# Patient Record
Sex: Male | Born: 1975 | Race: White | Hispanic: No | Marital: Single
Health system: Midwestern US, Academic
[De-identification: ages and names within clinical notes are randomized; demographics above are authoritative.]

---

## 2016-10-15 ENCOUNTER — Encounter: Admit: 2016-10-15 | Discharge: 2016-10-15 | Payer: MEDICAID

## 2016-10-15 NOTE — Telephone Encounter
Receive alternate request from [facility].   Insurance doesn't cover Contour next.   Called patient to ok sending script for one touch supplies/meter.   Patient agreed that was ok.   Sending script for One Touch Verio test strips, lancets and meter.

## 2016-12-31 ENCOUNTER — Encounter: Admit: 2016-12-31 | Discharge: 2016-12-31 | Payer: MEDICAID

## 2016-12-31 NOTE — Telephone Encounter
pt confirmed appt.

## 2017-01-07 ENCOUNTER — Encounter: Admit: 2017-01-07 | Discharge: 2017-01-07 | Payer: MEDICAID

## 2017-01-07 ENCOUNTER — Ambulatory Visit: Admit: 2017-01-07 | Discharge: 2017-01-07 | Payer: MEDICAID

## 2017-01-07 DIAGNOSIS — R748 Abnormal levels of other serum enzymes: ICD-10-CM

## 2017-01-07 DIAGNOSIS — E119 Type 2 diabetes mellitus without complications: Principal | ICD-10-CM

## 2017-01-07 DIAGNOSIS — I10 Essential (primary) hypertension: ICD-10-CM

## 2017-01-07 DIAGNOSIS — K709 Alcoholic liver disease, unspecified: Secondary | ICD-10-CM

## 2017-01-07 DIAGNOSIS — E785 Hyperlipidemia, unspecified: ICD-10-CM

## 2017-01-07 DIAGNOSIS — F1020 Alcohol dependence, uncomplicated: ICD-10-CM

## 2017-01-07 DIAGNOSIS — Z0000 Encounter for general adult medical examination without abnormal findings: ICD-10-CM

## 2017-01-07 DIAGNOSIS — Z23 Encounter for immunization: ICD-10-CM

## 2017-01-07 DIAGNOSIS — IMO0002 Alcohol use disorder: ICD-10-CM

## 2017-01-07 LAB — COMPREHENSIVE METABOLIC PANEL
Lab: 0.6 mg/dL (ref 0.3–1.2)
Lab: 0.9 mg/dL (ref 0.4–1.24)
Lab: 129 MMOL/L — ABNORMAL LOW (ref 137–147)
Lab: 4.8 MMOL/L (ref 3.5–5.1)
Lab: 408 mg/dL — ABNORMAL HIGH (ref 70–100)
Lab: 9.2 mg/dL (ref 8.5–10.6)
Lab: >60 mL/min (ref >60)

## 2017-01-07 LAB — LIPID PROFILE
Lab: 156 mg/dL — ABNORMAL HIGH (ref <150)
Lab: 24 mg/dL — ABNORMAL LOW (ref >40)
Lab: 312 mg/dL (ref 3–12)
Lab: 336 mg/dL — ABNORMAL HIGH (ref <200)
Lab: 58 mg/dL (ref <100)

## 2017-01-07 LAB — CBC AND DIFF
Lab: 5.1 M/UL (ref 4.4–5.5)
Lab: 5.9 10*3/uL (ref 4.5–11.0)

## 2017-01-07 LAB — BILIRUBIN, DIRECT: Lab: 0.1 mg/dL (ref <0.4)

## 2017-01-07 LAB — PROTIME INR (PT): Lab: 0.9 MMOL/L — ABNORMAL LOW (ref 0.8–1.2)

## 2017-01-07 NOTE — Progress Notes
Date of Service: 01/07/2017    Roy Madden is a 41 y.o. male.  DOB: 03-29-1976  MRN: [id number]     Subjective:             History of Present Illness    Mr. 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 Since last visit he has been very successful at reducing his drinking. He is now drinking 2 beers per week instead of >300 g alcohol per day. He has not lost any weight and is not exercising regularly. He notes that his diabetes has been poorly controlled with HbA1c last measured at >13. Currently he states that he feels well. He has some numbness in his legs but denies vision changes. He denies edema, abdominal swelling, hematemesis, melena, jaundice or episodes of confusion.       Past Medical History:   Diagnosis Date   ??? Alcohol use disorder    ??? Hyperlipidemia    ??? Hypertension    ??? Obesity, Class III, BMI 40-49.9 (morbid obesity) (HCC)    ??? Type 2 diabetes mellitus (HCC)           Review of Systems   Constitutional: Positive for diaphoresis and fatigue.   HENT: Positive for hearing loss.    Respiratory: Positive for chest tightness.    Cardiovascular: Positive for chest pain.   Musculoskeletal: Positive for arthralgias and back pain.   Psychiatric/Behavioral: Positive for confusion, decreased concentration, dysphoric mood and sleep disturbance. The patient is nervous/anxious.          Objective:         ??? albuterol (VENTOLIN HFA, PROAIR HFA) 90 mcg/actuation inhaler Inhale 2 Puffs by mouth every 6 hours as needed for Wheezing.   ??? ASCENSIA CONTOUR test strip Use 1 strip as directed before meals and at bedtime.   ??? canagliflozin (INVOKANA) 300 mg tablet Take 1 tablet by mouth daily with breakfast.   ??? fenofibrate nanocrystallized (TRICOR) 145 mg tablet Take 145 mg by mouth daily.   ??? ibuprofen (MOTRIN) 800 mg tablet Take 800 mg by mouth every 6 hours as needed for Pain. ??? insulin glargine (BASAGLAR KWIKPEN) 100 unit/mL (3 mL) injection PEN Inject 30 Units under the skin at bedtime daily.   ??? insulin lispro(+) (HUMALOG KWIKPEN) 100 unit/mL injection PEN Inject 10 Units into area(s) as directed three times daily with meals.   ??? losartan (COZAAR) 50 mg tablet Take 50 mg by mouth daily.   ??? metoclopramide (REGLAN) 10 mg tablet TAKE 1 TABLET BY MOUTH 3 TIMES A DAY   ??? mirtazapine (REMERON) 30 mg tablet Take 30 mg by mouth at bedtime daily.   ??? omeprazole DR(+) (PRILOSEC) 40 mg capsule Take 40 mg by mouth daily.   ??? oxyCODONE (OXY-IR) 15 mg tablet Take 15 mg by mouth every 4 hours as needed   ??? Sitagliptin-Metformin (JANUMET) 50-1,000 mg tab Take 1 Tab by mouth twice daily with meals.   ??? TRESIBA FLEXTOUCH U-100 100 unit/mL (3 mL) inpn      Vitals:    01/07/17 1418   BP: 139/81   Pulse: 102   Resp: 18   Temp: 36.8 ???C (98.2 ???F)   TempSrc: Oral   SpO2: 98%   Weight: 135.2 kg (298 lb)   Height: 182.9 cm (72)     Body mass index is 40.42 kg/m???.     Physical Exam  Constitutional: He is oriented to person, place, and time. He appears well-developed and well-nourished. No distress.   Obese   HENT:   Head: Normocephalic and atraumatic.   Eyes: EOM are normal. Pupils are equal, round, and reactive to light. No scleral icterus.   Neck: Normal range of motion. Neck supple. No thyromegaly present.   Cardiovascular: Normal rate, regular rhythm and normal heart sounds.    Pulmonary/Chest: Effort normal and breath sounds normal.   Abdominal: Soft. Normal appearance and bowel sounds are normal. He exhibits distension (obesity). He exhibits no shifting dullness. There is no hepatosplenomegaly (Could not palpate liver or spleen). There is no tenderness.   Musculoskeletal: He exhibits no edema.   Neurological: He is alert and oriented to person, place, and time.   Skin: Skin is warm and dry. No erythema.   Psychiatric: He has a normal mood and affect. His behavior is normal. Comprehensive Metabolic Profile    Lab Results   Component Value Date/Time    NA 129 (L) 01/07/2017 03:13 PM    K 4.8 01/07/2017 03:13 PM    CL 96 (L) 01/07/2017 03:13 PM    CO2 26 01/07/2017 03:13 PM    GAP 7 01/07/2017 03:13 PM    BUN 10 01/07/2017 03:13 PM    CR 0.96 01/07/2017 03:13 PM    GLU 408 (H) 01/07/2017 03:13 PM    Lab Results   Component Value Date/Time    CA 9.2 01/07/2017 03:13 PM    ALBUMIN 4.2 01/07/2017 03:13 PM    TOTPROT 6.6 01/07/2017 03:13 PM    ALKPHOS 113 (H) 01/07/2017 03:13 PM    AST 67 (H) 01/07/2017 03:13 PM    ALT 143 (H) 01/07/2017 03:13 PM    TOTBILI 0.6 01/07/2017 03:13 PM    GFR >60 01/07/2017 03:13 PM    GFRAA >60 01/07/2017 03:13 PM        Lab Results   Component Value Date    CHOL 336 (H) 01/07/2017    TRIG 1,561 (H) 01/07/2017    HDL 24 (L) 01/07/2017    LDL 58 01/07/2017    VLDL 161 01/07/2017    NONHDLCHOL 312 01/07/2017               Assessment and Plan:    1. 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 In spite of his cutting back dramatically on his drinking, his ALT remains quite high with AST<ALT. 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    2. Diabetes mellitus. His diabetes is very poorly controled with glucose >400 and HbA1c 13.0 today. He admitted to me that he just stopped taking his insulin because he dose not like to give himself injections. He is supposed to be taking 60 U per day. He needs to follow up with his PCP as soon as possible for diabetes control. I urged him to go back on his medications as prescribed. He is also on invokana and Janumet.    3. Hyperlipidemia. He has very elevated total cholesterol, LDL and triglycerides and low HDL. He is on fenofibrate but not on a statin. I think he would likely benefit from statin therapy and recommend he discuss this with his PCP. There is no liver contraindication to taking statins in this case. 4. Obesity. Weight loss is critical to his health for multiple reasons including diabetes, hyperlipidemia and NASH. I have  suggested that he get a consultation for bariatric surgery to discuss the pro and cons with the surgery team.    5. Hypertension. Reasonable control today      Plan:  CBC, CMP, INR today --DONE  HbA1c, lipid panel --DONE  Resume all his diabetes meds including insulin  Follow up with his PCP for Diabetes and lipid managment  Referral for consultation re: bariatric surgery  Continue to avoid heavy drinking  F/U with me in 6 months

## 2017-01-08 LAB — HEMOGLOBIN A1C: Lab: 13 % — ABNORMAL HIGH (ref 4.0–6.0)

## 2017-01-26 ENCOUNTER — Encounter: Admit: 2017-01-26 | Discharge: 2017-01-26 | Payer: MEDICAID

## 2017-01-26 NOTE — Telephone Encounter
[facility] via [facility]mymeds.   Invokana 300 mg tablets has been rejected.   Routing to [employer], APRN for alternative med.

## 2017-01-26 NOTE — Telephone Encounter
Are any meds in that class covered, farxiga or jardiance?

## 2017-01-27 MED ORDER — EMPAGLIFLOZIN 25 MG PO TAB
25 mg | ORAL_TABLET | Freq: Every day | ORAL | 3 refills | Status: AC
Start: 2017-01-27 — End: ?

## 2017-01-27 NOTE — Telephone Encounter
Both Jardiance and Marcelline Deist are covered

## 2017-01-27 NOTE — Telephone Encounter
I have changed to Jardiance.    Pt will need an appt for future care here, has cancelled the last 2.Marland Kitchen

## 2017-03-24 ENCOUNTER — Encounter: Admit: 2017-03-24 | Discharge: 2017-03-24 | Payer: MEDICAID

## 2017-03-24 NOTE — Telephone Encounter
[facility]  States Jardiance requires PA  Can use Covermymeds for PA  KEY: [id number]    Called pharmacy to clarify, as phone note from October states that [place]Jardiance and Farxiga are preferred level 1 medications  They state they are still getting a PA required notification    Called insurance at [phone] to clarify what medications are covered/not covered, as we have conflicting information  This number states that [employer]'s number for PA's is [phone], called this number to clarify what medications are preferred  Waited on hold for 30+ minutes, representative I spoke with was unable to tell me which medications are preferred    Completed PA through covermymeds: "This request has received a Favorable outcome.  Please note any additional information provided by [employer] at the bottom of this request.Questionnaire submitted. PA Case [id number] Status: Approved. Authorization and Notifications Completed."  Covermymeds notification of Jardiance approval  Will await fax confirmation of approval

## 2017-03-25 NOTE — Telephone Encounter
Received fax from [employer]  States Roy Madden has been approved through 03/24/18  Copy of letter to [facility] to scan

## 2017-05-13 ENCOUNTER — Encounter: Admit: 2017-05-13 | Discharge: 2017-05-13 | Payer: MEDICAID

## 2017-05-20 ENCOUNTER — Ambulatory Visit: Admit: 2017-05-20 | Discharge: 2017-05-20 | Payer: MEDICAID

## 2017-05-20 ENCOUNTER — Encounter: Admit: 2017-05-20 | Discharge: 2017-05-20 | Payer: MEDICAID

## 2017-05-20 DIAGNOSIS — E119 Type 2 diabetes mellitus without complications: ICD-10-CM

## 2017-05-20 DIAGNOSIS — Z0000 Encounter for general adult medical examination without abnormal findings: ICD-10-CM

## 2017-05-20 DIAGNOSIS — IMO0002 Alcohol use disorder: ICD-10-CM

## 2017-05-20 DIAGNOSIS — I10 Essential (primary) hypertension: ICD-10-CM

## 2017-05-20 DIAGNOSIS — E785 Hyperlipidemia, unspecified: ICD-10-CM

## 2017-05-20 DIAGNOSIS — K709 Alcoholic liver disease, unspecified: ICD-10-CM

## 2017-05-20 DIAGNOSIS — F1020 Alcohol dependence, uncomplicated: Principal | ICD-10-CM

## 2017-05-20 DIAGNOSIS — K760 Fatty (change of) liver, not elsewhere classified: ICD-10-CM

## 2017-05-20 DIAGNOSIS — Z23 Encounter for immunization: ICD-10-CM

## 2017-05-20 DIAGNOSIS — R748 Abnormal levels of other serum enzymes: Secondary | ICD-10-CM

## 2017-05-20 LAB — CBC AND DIFF
Lab: 0 % (ref >60)
Lab: 0 10*3/uL (ref 0–0.20)
Lab: 0.1 10*3/uL (ref 0–0.45)
Lab: 0.6 10*3/uL (ref 0–0.80)
Lab: 1 % (ref >60)
Lab: 10 FL (ref 7–11)
Lab: 12 % (ref 11–15)
Lab: 17 g/dL — ABNORMAL HIGH (ref 13.5–16.5)
Lab: 186 K/UL — ABNORMAL HIGH (ref 150–400)
Lab: 2.4 10*3/uL (ref 1.0–4.8)
Lab: 28 % — ABNORMAL HIGH (ref 24–44)
Lab: 29 pg (ref 26–34)
Lab: 33 g/dL (ref 32.0–36.0)
Lab: 5.5 10*3/uL (ref 1.8–7.0)
Lab: 5.7 M/UL — ABNORMAL HIGH (ref 4.4–5.5)
Lab: 51 % — ABNORMAL HIGH (ref 40–50)
Lab: 64 % (ref 41–77)
Lab: 7 % (ref 4–12)
Lab: 8.7 10*3/uL — ABNORMAL LOW (ref 4.5–11.0)
Lab: 89 FL (ref 80–100)

## 2017-05-20 LAB — COMPREHENSIVE METABOLIC PANEL
Lab: 132 MMOL/L — ABNORMAL LOW (ref 137–147)
Lab: 4.6 MMOL/L (ref 3.5–5.1)

## 2017-05-20 LAB — PROTIME INR (PT): Lab: 0.9 (ref 0.8–1.2)

## 2017-05-20 LAB — BILIRUBIN, DIRECT

## 2017-05-21 LAB — HEMOGLOBIN A1C: Lab: 12 % — ABNORMAL HIGH (ref 4.0–6.0)

## 2017-05-24 ENCOUNTER — Encounter: Admit: 2017-05-24 | Discharge: 2017-05-24 | Payer: MEDICAID

## 2017-05-24 DIAGNOSIS — IMO0002 Alcohol use disorder: ICD-10-CM

## 2017-05-24 DIAGNOSIS — I10 Essential (primary) hypertension: ICD-10-CM

## 2017-05-24 DIAGNOSIS — E119 Type 2 diabetes mellitus without complications: Principal | ICD-10-CM

## 2017-05-24 DIAGNOSIS — E785 Hyperlipidemia, unspecified: ICD-10-CM

## 2017-06-23 ENCOUNTER — Encounter: Admit: 2017-06-23 | Discharge: 2017-06-23 | Payer: MEDICAID

## 2017-06-23 DIAGNOSIS — E119 Type 2 diabetes mellitus without complications: Principal | ICD-10-CM

## 2017-06-23 MED ORDER — CONTOUR TEST STRIPS MISC STRP
1 | ORAL_STRIP | Freq: Before meals | 3 refills | Status: AC
Start: 2017-06-23 — End: ?

## 2017-11-05 ENCOUNTER — Encounter: Admit: 2017-11-05 | Discharge: 2017-11-05 | Payer: MEDICAID

## 2017-11-09 ENCOUNTER — Ambulatory Visit: Admit: 2017-11-09 | Discharge: 2017-11-09 | Payer: MEDICAID

## 2017-11-09 ENCOUNTER — Encounter: Admit: 2017-11-09 | Discharge: 2017-11-09 | Payer: MEDICAID

## 2017-11-09 DIAGNOSIS — M5116 Intervertebral disc disorders with radiculopathy, lumbar region: Principal | ICD-10-CM

## 2017-11-09 DIAGNOSIS — I10 Essential (primary) hypertension: ICD-10-CM

## 2017-11-09 DIAGNOSIS — E785 Hyperlipidemia, unspecified: ICD-10-CM

## 2017-11-09 DIAGNOSIS — M5416 Radiculopathy, lumbar region: Secondary | ICD-10-CM

## 2017-11-09 DIAGNOSIS — E119 Type 2 diabetes mellitus without complications: Principal | ICD-10-CM

## 2017-11-09 DIAGNOSIS — IMO0002 Alcohol use disorder: ICD-10-CM

## 2017-11-09 MED ORDER — TIZANIDINE 4 MG PO TAB
4 mg | ORAL_TABLET | Freq: Two times a day (BID) | ORAL | 3 refills | Status: AC
Start: 2017-11-09 — End: 2017-11-09

## 2017-11-09 MED ORDER — TIZANIDINE 4 MG PO TAB
4 mg | ORAL_TABLET | Freq: Two times a day (BID) | ORAL | 3 refills | Status: AC
Start: 2017-11-09 — End: ?

## 2017-11-19 ENCOUNTER — Encounter: Admit: 2017-11-19 | Discharge: 2017-11-19 | Payer: MEDICAID

## 2017-11-23 ENCOUNTER — Encounter: Admit: 2017-11-23 | Discharge: 2017-11-23 | Payer: MEDICAID

## 2017-11-27 ENCOUNTER — Encounter: Admit: 2017-11-27 | Discharge: 2017-11-27 | Payer: MEDICAID

## 2017-12-01 ENCOUNTER — Encounter: Admit: 2017-12-01 | Discharge: 2017-12-01 | Payer: MEDICAID

## 2017-12-03 ENCOUNTER — Encounter: Admit: 2017-12-03 | Discharge: 2017-12-03 | Payer: MEDICAID

## 2017-12-03 ENCOUNTER — Ambulatory Visit: Admit: 2017-12-03 | Discharge: 2017-12-04 | Payer: MEDICAID

## 2017-12-03 DIAGNOSIS — I10 Essential (primary) hypertension: ICD-10-CM

## 2017-12-03 DIAGNOSIS — E785 Hyperlipidemia, unspecified: ICD-10-CM

## 2017-12-03 DIAGNOSIS — IMO0002 Alcohol use disorder: ICD-10-CM

## 2017-12-03 DIAGNOSIS — E119 Type 2 diabetes mellitus without complications: Principal | ICD-10-CM

## 2017-12-03 DIAGNOSIS — M47817 Spondylosis without myelopathy or radiculopathy, lumbosacral region: Principal | ICD-10-CM

## 2017-12-21 ENCOUNTER — Ambulatory Visit: Admit: 2017-12-21 | Discharge: 2017-12-21 | Payer: MEDICAID

## 2017-12-21 DIAGNOSIS — M47817 Spondylosis without myelopathy or radiculopathy, lumbosacral region: Principal | ICD-10-CM

## 2018-01-04 ENCOUNTER — Encounter: Admit: 2018-01-04 | Discharge: 2018-01-04 | Payer: MEDICAID

## 2018-01-04 DIAGNOSIS — M47817 Spondylosis without myelopathy or radiculopathy, lumbosacral region: Principal | ICD-10-CM

## 2018-01-04 DIAGNOSIS — M5416 Radiculopathy, lumbar region: Principal | ICD-10-CM

## 2018-01-04 DIAGNOSIS — E119 Type 2 diabetes mellitus without complications: Principal | ICD-10-CM

## 2018-01-04 DIAGNOSIS — IMO0002 Alcohol use disorder: ICD-10-CM

## 2018-01-04 DIAGNOSIS — I10 Essential (primary) hypertension: ICD-10-CM

## 2018-01-04 DIAGNOSIS — E785 Hyperlipidemia, unspecified: ICD-10-CM

## 2018-01-04 MED ORDER — BUPIVACAINE (PF) 0.25 % (2.5 MG/ML) IJ SOLN
6 mL | Freq: Once | INTRAMUSCULAR | 0 refills | Status: CP
Start: 2018-01-04 — End: ?
  Administered 2018-01-04: 15:00:00 6 mL via INTRAMUSCULAR

## 2018-01-05 ENCOUNTER — Ambulatory Visit: Admit: 2018-01-04 | Discharge: 2018-01-05 | Payer: MEDICAID

## 2018-01-05 DIAGNOSIS — E119 Type 2 diabetes mellitus without complications: ICD-10-CM

## 2018-01-05 DIAGNOSIS — E785 Hyperlipidemia, unspecified: ICD-10-CM

## 2018-01-05 DIAGNOSIS — M5416 Radiculopathy, lumbar region: ICD-10-CM

## 2018-01-05 DIAGNOSIS — M47817 Spondylosis without myelopathy or radiculopathy, lumbosacral region: Principal | ICD-10-CM

## 2018-01-05 DIAGNOSIS — I10 Essential (primary) hypertension: ICD-10-CM

## 2018-01-11 ENCOUNTER — Encounter: Admit: 2018-01-11 | Discharge: 2018-01-11 | Payer: MEDICAID

## 2018-01-11 DIAGNOSIS — I10 Essential (primary) hypertension: ICD-10-CM

## 2018-01-11 DIAGNOSIS — E785 Hyperlipidemia, unspecified: ICD-10-CM

## 2018-01-11 DIAGNOSIS — IMO0002 Alcohol use disorder: ICD-10-CM

## 2018-01-11 DIAGNOSIS — E119 Type 2 diabetes mellitus without complications: Principal | ICD-10-CM

## 2018-01-11 DIAGNOSIS — M5416 Radiculopathy, lumbar region: Principal | ICD-10-CM

## 2018-01-11 MED ORDER — METAXALONE 800 MG PO TAB
800 mg | ORAL_TABLET | Freq: Two times a day (BID) | ORAL | 3 refills | Status: AC | PRN
Start: 2018-01-11 — End: ?

## 2018-01-12 ENCOUNTER — Ambulatory Visit: Admit: 2018-01-11 | Discharge: 2018-01-12 | Payer: MEDICAID

## 2018-01-12 ENCOUNTER — Encounter: Admit: 2018-01-12 | Discharge: 2018-01-12 | Payer: MEDICAID

## 2018-01-12 DIAGNOSIS — M5416 Radiculopathy, lumbar region: Secondary | ICD-10-CM

## 2018-01-12 DIAGNOSIS — I10 Essential (primary) hypertension: ICD-10-CM

## 2018-01-12 DIAGNOSIS — E119 Type 2 diabetes mellitus without complications: ICD-10-CM

## 2018-01-12 DIAGNOSIS — E785 Hyperlipidemia, unspecified: ICD-10-CM

## 2018-01-12 DIAGNOSIS — Z538 Procedure and treatment not carried out for other reasons: ICD-10-CM

## 2018-01-12 DIAGNOSIS — M47817 Spondylosis without myelopathy or radiculopathy, lumbosacral region: Principal | ICD-10-CM

## 2018-01-13 ENCOUNTER — Encounter: Admit: 2018-01-13 | Discharge: 2018-01-13 | Payer: MEDICAID

## 2018-01-21 ENCOUNTER — Ambulatory Visit: Admit: 2018-01-21 | Discharge: 2018-01-21 | Payer: MEDICAID

## 2018-01-21 ENCOUNTER — Encounter: Admit: 2018-01-21 | Discharge: 2018-01-21 | Payer: MEDICAID

## 2018-01-21 DIAGNOSIS — E119 Type 2 diabetes mellitus without complications: Principal | ICD-10-CM

## 2018-01-21 DIAGNOSIS — R140 Abdominal distension (gaseous): ICD-10-CM

## 2018-01-21 DIAGNOSIS — R6881 Early satiety: ICD-10-CM

## 2018-01-21 DIAGNOSIS — R1011 Right upper quadrant pain: ICD-10-CM

## 2018-01-21 DIAGNOSIS — E782 Mixed hyperlipidemia: ICD-10-CM

## 2018-01-21 DIAGNOSIS — R634 Abnormal weight loss: ICD-10-CM

## 2018-01-21 DIAGNOSIS — E785 Hyperlipidemia, unspecified: ICD-10-CM

## 2018-01-21 DIAGNOSIS — I10 Essential (primary) hypertension: ICD-10-CM

## 2018-01-21 DIAGNOSIS — IMO0002 Alcohol use disorder: ICD-10-CM

## 2018-01-21 LAB — COMPREHENSIVE METABOLIC PANEL
Lab: 131 MMOL/L — ABNORMAL LOW (ref 137–147)
Lab: 137 U/L — ABNORMAL HIGH (ref 25–110)
Lab: 29 MMOL/L — ABNORMAL HIGH (ref 21–30)
Lab: 4.5 g/dL (ref 3.5–5.0)
Lab: 4.6 MMOL/L (ref 3.5–5.1)
Lab: 46 U/L — ABNORMAL HIGH (ref 7–40)
Lab: 88 U/L — ABNORMAL HIGH (ref 7–56)
Lab: >60 mL/min (ref >60)
Lab: >60 mL/min (ref >60)
Lab: 8 (ref 3–12)

## 2018-01-21 LAB — LIPID PROFILE
Lab: 180 mg/dL — ABNORMAL HIGH (ref <150)
Lab: 27 mg/dL — ABNORMAL LOW (ref >40)
Lab: 334 mg/dL (ref 6.0–8.0)
Lab: 360 mg/dL (ref 8.5–10.6)
Lab: 361 mg/dL — ABNORMAL HIGH (ref <200)
Lab: 55 mg/dL (ref <100)

## 2018-01-21 LAB — AMYLASE: Lab: 12 U/L — ABNORMAL LOW (ref 24–100)

## 2018-01-21 LAB — CBC
Lab: 5.4 M/UL (ref 4.4–5.5)
Lab: 6.4 10*3/uL (ref 4.5–11.0)

## 2018-01-21 LAB — LIPASE: Lab: 33 U/L (ref 11–82)

## 2018-01-21 MED ORDER — BUSPIRONE 5 MG PO TAB
2.5 mg | ORAL_TABLET | Freq: Three times a day (TID) | ORAL | 3 refills | Status: AC
Start: 2018-01-21 — End: 2018-05-24

## 2018-01-21 MED ORDER — SODIUM CHLORIDE 0.9 % IV SOLP
INTRAVENOUS | 0 refills | Status: CN
Start: 2018-01-21 — End: ?

## 2018-01-22 MED ORDER — METHOCARBAMOL 750 MG PO TAB
750 mg | ORAL_TABLET | Freq: Two times a day (BID) | ORAL | 0 refills | Status: AC
Start: 2018-01-22 — End: ?

## 2018-01-25 ENCOUNTER — Encounter: Admit: 2018-01-25 | Discharge: 2018-01-25 | Payer: MEDICAID

## 2018-02-08 ENCOUNTER — Encounter: Admit: 2018-02-08 | Discharge: 2018-02-08 | Payer: MEDICAID

## 2018-02-17 ENCOUNTER — Encounter: Admit: 2018-02-17 | Discharge: 2018-02-17 | Payer: MEDICAID

## 2018-03-16 ENCOUNTER — Ambulatory Visit: Admit: 2018-03-16 | Discharge: 2018-03-16 | Payer: MEDICAID

## 2018-03-16 ENCOUNTER — Encounter: Admit: 2018-03-16 | Discharge: 2018-03-16 | Payer: MEDICAID

## 2018-03-16 DIAGNOSIS — R1011 Right upper quadrant pain: ICD-10-CM

## 2018-03-16 DIAGNOSIS — R140 Abdominal distension (gaseous): ICD-10-CM

## 2018-03-16 DIAGNOSIS — R634 Abnormal weight loss: ICD-10-CM

## 2018-03-16 DIAGNOSIS — I10 Essential (primary) hypertension: ICD-10-CM

## 2018-03-16 DIAGNOSIS — E119 Type 2 diabetes mellitus without complications: ICD-10-CM

## 2018-03-16 DIAGNOSIS — E785 Hyperlipidemia, unspecified: ICD-10-CM

## 2018-03-16 DIAGNOSIS — E782 Mixed hyperlipidemia: ICD-10-CM

## 2018-03-16 DIAGNOSIS — E781 Pure hyperglyceridemia: ICD-10-CM

## 2018-03-16 DIAGNOSIS — Z794 Long term (current) use of insulin: ICD-10-CM

## 2018-03-16 DIAGNOSIS — R1013 Epigastric pain: Principal | ICD-10-CM

## 2018-03-16 DIAGNOSIS — IMO0002 Alcohol use disorder: ICD-10-CM

## 2018-03-16 LAB — POC GLUCOSE
Lab: 424 mg/dL — ABNORMAL HIGH (ref 70–100)
Lab: 466 mg/dL — ABNORMAL HIGH (ref 70–100)

## 2018-03-16 MED ORDER — LIDOCAINE (PF) 200 MG/10 ML (2 %) IJ SYRG
0 refills | Status: DC
Start: 2018-03-16 — End: 2018-03-16
  Administered 2018-03-16: 16:00:00 100 mg via INTRAVENOUS

## 2018-03-16 MED ORDER — FENTANYL CITRATE (PF) 50 MCG/ML IJ SOLN
25 ug | INTRAVENOUS | 0 refills | Status: CN | PRN
Start: 2018-03-16 — End: ?

## 2018-03-16 MED ORDER — FENTANYL CITRATE (PF) 50 MCG/ML IJ SOLN
0 refills | Status: DC
Start: 2018-03-16 — End: 2018-03-16
  Administered 2018-03-16: 16:00:00 50 ug via INTRAVENOUS

## 2018-03-16 MED ORDER — PROPOFOL INJ 10 MG/ML IV VIAL
0 refills | Status: DC
Start: 2018-03-16 — End: 2018-03-16
  Administered 2018-03-16: 16:00:00 60 mg via INTRAVENOUS
  Administered 2018-03-16: 16:00:00 10 mg via INTRAVENOUS
  Administered 2018-03-16: 16:00:00 50 mg via INTRAVENOUS
  Administered 2018-03-16: 16:00:00 80 mg via INTRAVENOUS
  Administered 2018-03-16: 16:00:00 50 mg via INTRAVENOUS

## 2018-03-16 MED ORDER — ONDANSETRON HCL (PF) 4 MG/2 ML IJ SOLN
4 mg | Freq: Once | INTRAVENOUS | 0 refills | Status: CN | PRN
Start: 2018-03-16 — End: ?

## 2018-03-16 MED ORDER — OXYCODONE 5 MG PO TAB
5 mg | Freq: Once | ORAL | 0 refills | Status: CN | PRN
Start: 2018-03-16 — End: ?

## 2018-03-16 MED ORDER — PROMETHAZINE 25 MG/ML IJ SOLN
6.25 mg | INTRAVENOUS | 0 refills | Status: CN | PRN
Start: 2018-03-16 — End: ?

## 2018-03-16 MED ORDER — FENTANYL CITRATE (PF) 50 MCG/ML IJ SOLN
50 ug | INTRAVENOUS | 0 refills | Status: CN | PRN
Start: 2018-03-16 — End: ?

## 2018-03-16 MED ORDER — LACTATED RINGERS IV SOLP
1000 mL | Freq: Once | INTRAVENOUS | 0 refills | Status: CP
Start: 2018-03-16 — End: ?
  Administered 2018-03-16: 17:00:00 1000 mL via INTRAVENOUS

## 2018-03-16 MED ORDER — PROPOFOL 10 MG/ML IV EMUL 20 ML (INFUSION)(AM)(OR)
INTRAVENOUS | 0 refills | Status: DC
Start: 2018-03-16 — End: 2018-03-16
  Administered 2018-03-16: 16:00:00 140 ug/kg/min via INTRAVENOUS
  Administered 2018-03-16: 16:00:00 20.000 mL via INTRAVENOUS

## 2018-03-16 MED ORDER — HALOPERIDOL LACTATE 5 MG/ML IJ SOLN
1 mg | Freq: Once | INTRAVENOUS | 0 refills | Status: CN | PRN
Start: 2018-03-16 — End: ?

## 2018-03-18 ENCOUNTER — Encounter: Admit: 2018-03-18 | Discharge: 2018-03-18 | Payer: MEDICAID

## 2018-03-18 DIAGNOSIS — I10 Essential (primary) hypertension: ICD-10-CM

## 2018-03-18 DIAGNOSIS — E119 Type 2 diabetes mellitus without complications: Principal | ICD-10-CM

## 2018-03-18 DIAGNOSIS — IMO0002 Alcohol use disorder: ICD-10-CM

## 2018-03-18 DIAGNOSIS — E785 Hyperlipidemia, unspecified: ICD-10-CM

## 2018-03-19 ENCOUNTER — Encounter: Admit: 2018-03-19 | Discharge: 2018-03-19 | Payer: MEDICAID

## 2018-03-19 DIAGNOSIS — R198 Other specified symptoms and signs involving the digestive system and abdomen: ICD-10-CM

## 2018-03-19 DIAGNOSIS — R6881 Early satiety: Principal | ICD-10-CM

## 2018-03-21 ENCOUNTER — Encounter: Admit: 2018-03-21 | Discharge: 2018-03-21 | Payer: MEDICAID

## 2018-05-24 ENCOUNTER — Encounter: Admit: 2018-05-24 | Discharge: 2018-05-24 | Payer: MEDICAID

## 2018-05-24 DIAGNOSIS — R1011 Right upper quadrant pain: Secondary | ICD-10-CM

## 2018-05-24 DIAGNOSIS — R634 Abnormal weight loss: Secondary | ICD-10-CM

## 2018-05-24 MED ORDER — BUSPIRONE 5 MG PO TAB
ORAL_TABLET | Freq: Three times a day (TID) | 3 refills | Status: AC
Start: 2018-05-24 — End: 2018-12-27

## 2018-06-09 ENCOUNTER — Encounter: Admit: 2018-06-09 | Discharge: 2018-06-09 | Payer: MEDICAID

## 2018-06-16 ENCOUNTER — Ambulatory Visit: Admit: 2018-06-16 | Discharge: 2018-06-16 | Payer: MEDICAID

## 2018-06-16 ENCOUNTER — Encounter: Admit: 2018-06-16 | Discharge: 2018-06-16 | Payer: MEDICAID

## 2018-06-16 DIAGNOSIS — I10 Essential (primary) hypertension: ICD-10-CM

## 2018-06-16 DIAGNOSIS — E119 Type 2 diabetes mellitus without complications: Principal | ICD-10-CM

## 2018-06-16 DIAGNOSIS — E1139 Type 2 diabetes mellitus with other diabetic ophthalmic complication: ICD-10-CM

## 2018-06-16 DIAGNOSIS — E785 Hyperlipidemia, unspecified: ICD-10-CM

## 2018-06-16 DIAGNOSIS — R748 Abnormal levels of other serum enzymes: ICD-10-CM

## 2018-06-16 DIAGNOSIS — K7010 Alcoholic hepatitis without ascites: ICD-10-CM

## 2018-06-16 DIAGNOSIS — Z794 Long term (current) use of insulin: ICD-10-CM

## 2018-06-16 DIAGNOSIS — K7581 Nonalcoholic steatohepatitis (NASH): ICD-10-CM

## 2018-06-16 DIAGNOSIS — K760 Fatty (change of) liver, not elsewhere classified: ICD-10-CM

## 2018-06-16 DIAGNOSIS — IMO0002 Alcohol use disorder: ICD-10-CM

## 2018-06-16 LAB — CBC AND DIFF
Lab: 1 % (ref >60)
Lab: 12 % (ref 11–15)
Lab: 16 g/dL (ref 13.5–16.5)
Lab: 169 K/UL — ABNORMAL HIGH (ref 150–400)
Lab: 2 % (ref >60)
Lab: 2 10*3/uL (ref 1.0–4.8)
Lab: 34 % — ABNORMAL HIGH (ref 24–44)
Lab: 35 g/dL (ref 32.0–36.0)
Lab: 5.2 M/UL — ABNORMAL HIGH (ref 4.4–5.5)
Lab: 5.9 K/UL — ABNORMAL LOW (ref 4.5–11.0)
Lab: 57 % (ref 41–77)
Lab: 6 % (ref 4–12)
Lab: 9.3 FL — ABNORMAL HIGH (ref 7–11)

## 2018-06-16 LAB — BILIRUBIN, DIRECT: Lab: 0.1 mg/dL (ref <0.4)

## 2018-06-16 LAB — COMPREHENSIVE METABOLIC PANEL
Lab: 134 MMOL/L — ABNORMAL LOW (ref 137–147)
Lab: 4.2 MMOL/L (ref 3.5–5.1)

## 2018-06-16 LAB — PROTIME INR (PT): Lab: 0.9 (ref 0.8–1.2)

## 2018-06-16 NOTE — Progress Notes
Date of Service: 06/16/2018    Edder Bellanca is a 43 y.o. male.  DOB: 08-11-1975  MRN: [id number]     Subjective:             History of Present Illness    Mr. 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 Over the past year he says he has not been dieting but he has lost 17 pounds. He continues to drink alcohol but has cut back. He now drinks tequila, 2-3 times a day 1-3 oz per time, typically drinking about 3-5 oz of tequila per day. He notes that his diabetes has been very poorly controled with blood glucose measurements usually >300 in spite of combination oral therapy and insulin. He believes he has had some mental confusion lately and he states that his primary care physician, Dr. 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 He has some ankle edema but denies  abdominal swelling, hematemesis, melena, or jaundice. He states that he is seeing an ophthalmologist due to vision loss and has begun noticing numbness and tingling in feet and fingers.      Medical History:   Diagnosis Date   ??? Alcohol use disorder    ??? Hyperlipidemia    ??? Hypertension    ??? Obesity, Class III, BMI 40-49.9 (morbid obesity) (HCC)    ??? Type 2 diabetes mellitus (HCC)         Review of Systems   Constitutional: Positive for fatigue.   HENT: Negative.    Eyes: Positive for visual disturbance.   Respiratory: Negative.    Cardiovascular: Positive for leg swelling.   Gastrointestinal: Negative.    Endocrine: Negative.    Genitourinary: Negative.    Musculoskeletal: Positive for arthralgias.   Skin: Negative.    Allergic/Immunologic: Negative.    Neurological: Positive for numbness.   Hematological: Negative.    Psychiatric/Behavioral: Negative.          Objective:         ??? ASCENSIA CONTOUR test strip Use one strip as directed before meals and at bedtime.   ??? busPIRone (BUSPAR) 5 mg tablet TAKE 1/2 TABLET BY MOUTH THREE TIMES DAILY

## 2018-06-17 LAB — HEMOGLOBIN A1C: Lab: 12 % — ABNORMAL HIGH (ref 4.0–6.0)

## 2018-06-18 ENCOUNTER — Encounter: Admit: 2018-06-18 | Discharge: 2018-06-18 | Payer: MEDICAID

## 2018-06-18 DIAGNOSIS — IMO0002 Alcohol use disorder: ICD-10-CM

## 2018-06-18 DIAGNOSIS — E785 Hyperlipidemia, unspecified: ICD-10-CM

## 2018-06-18 DIAGNOSIS — E119 Type 2 diabetes mellitus without complications: Principal | ICD-10-CM

## 2018-06-18 DIAGNOSIS — K7581 Nonalcoholic steatohepatitis (NASH): ICD-10-CM

## 2018-06-18 DIAGNOSIS — K7010 Alcoholic hepatitis without ascites: ICD-10-CM

## 2018-06-18 DIAGNOSIS — I10 Essential (primary) hypertension: ICD-10-CM

## 2018-06-21 ENCOUNTER — Encounter: Admit: 2018-06-21 | Discharge: 2018-06-21 | Payer: MEDICAID

## 2018-12-25 ENCOUNTER — Encounter: Admit: 2018-12-25 | Discharge: 2018-12-25

## 2018-12-25 DIAGNOSIS — R1011 Right upper quadrant pain: Secondary | ICD-10-CM

## 2018-12-25 DIAGNOSIS — R634 Abnormal weight loss: Secondary | ICD-10-CM

## 2018-12-27 MED ORDER — BUSPIRONE 5 MG PO TAB
ORAL_TABLET | Freq: Three times a day (TID) | 3 refills | Status: DC
Start: 2018-12-27 — End: 2019-05-03

## 2018-12-27 NOTE — Telephone Encounter
Refill request received for buspar  Last OV 01/21/2018  Last fill 05/24/2018 45 tabs w/3 refills    Routing to Dr. Candi Leash for approval/refusal

## 2019-05-03 ENCOUNTER — Encounter: Admit: 2019-05-03 | Discharge: 2019-05-03 | Payer: MEDICAID

## 2019-05-03 DIAGNOSIS — R1011 Right upper quadrant pain: Secondary | ICD-10-CM

## 2019-05-03 DIAGNOSIS — R634 Abnormal weight loss: Secondary | ICD-10-CM

## 2019-05-03 MED ORDER — BUSPIRONE 5 MG PO TAB
2.5 mg | ORAL_TABLET | Freq: Three times a day (TID) | ORAL | 3 refills | Status: AC
Start: 2019-05-03 — End: ?

## 2019-05-05 IMAGING — US ABDCM
1 series · 14 of 25 positions shown · non-contrast
Comparison: none

[Series 1: us abdomen complete · 14 of 87 slices shown]
[im 1/87]
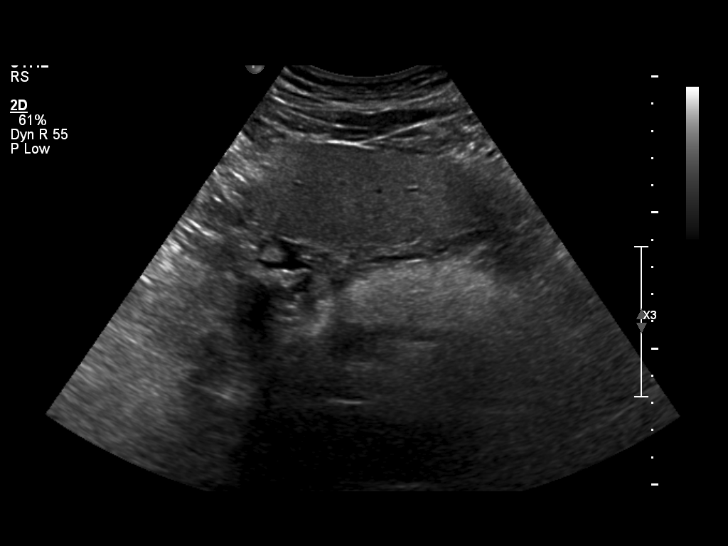
[im 8/87]
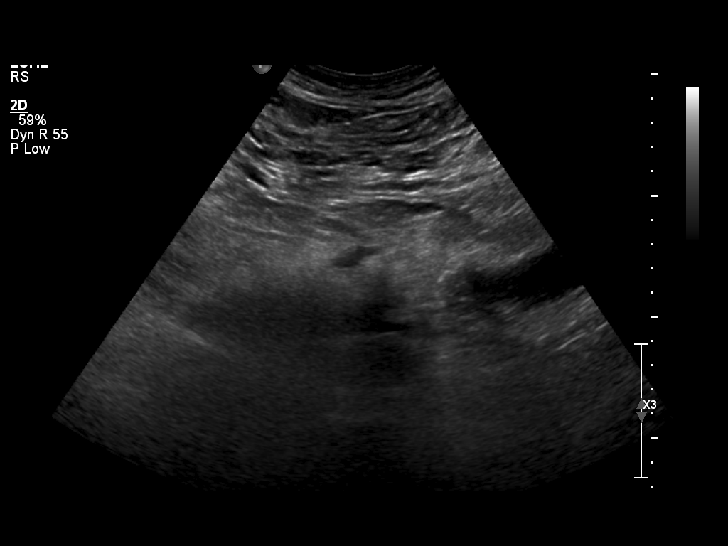
[im 15/87]
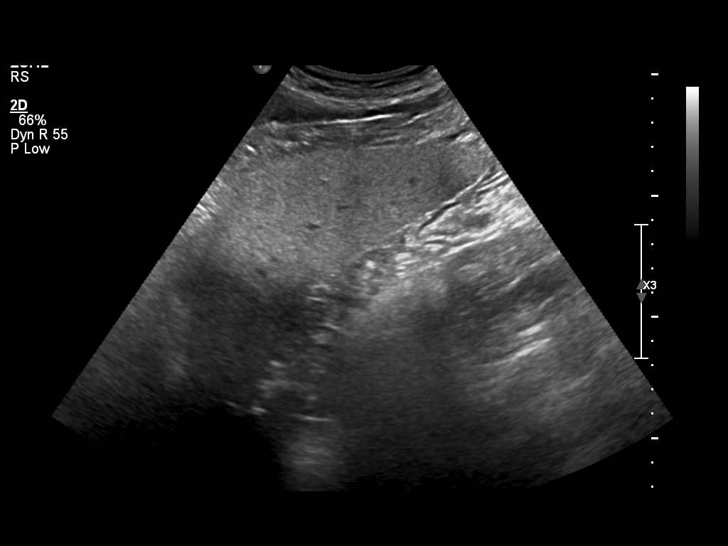
[im 22/87]
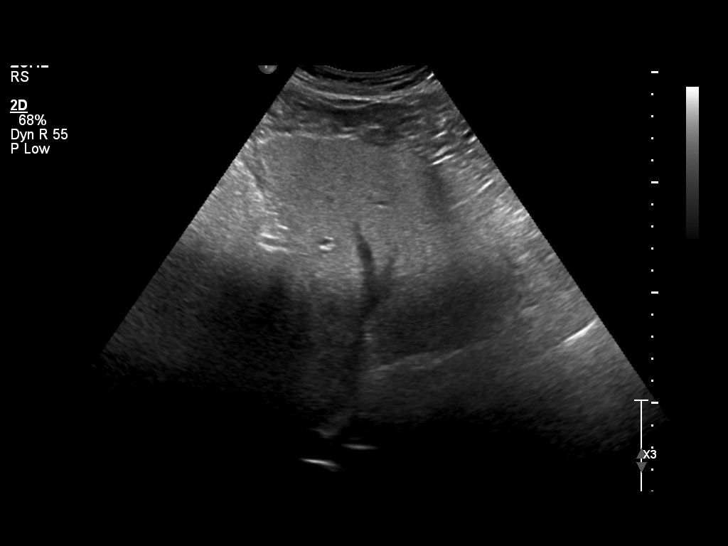
[im 29/87]
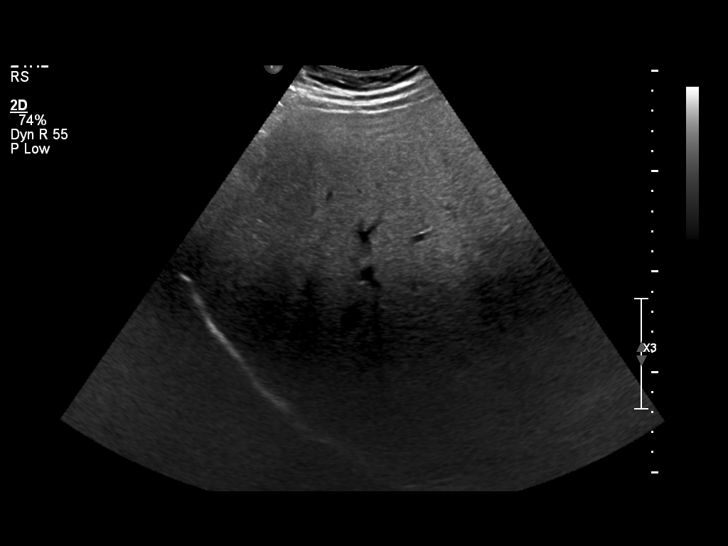
[im 33/87]
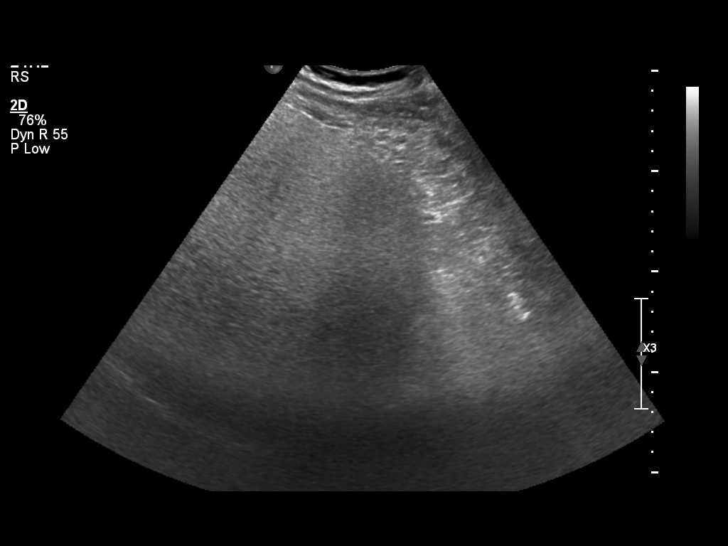
[im 40/87]
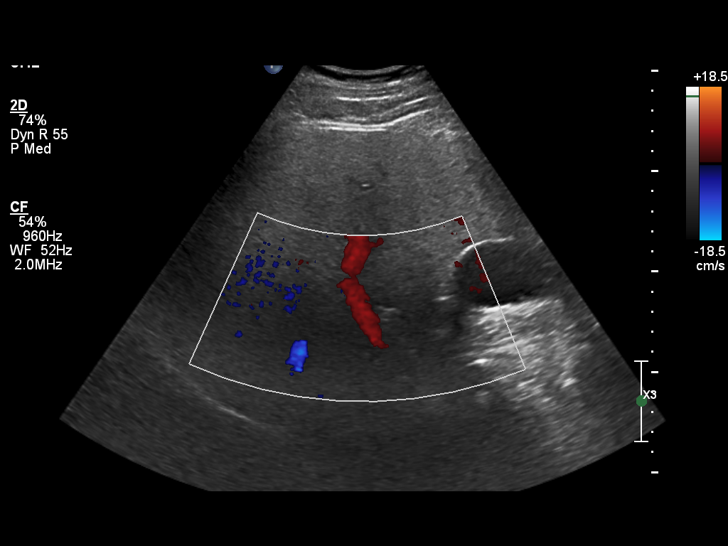
[im 47/87]
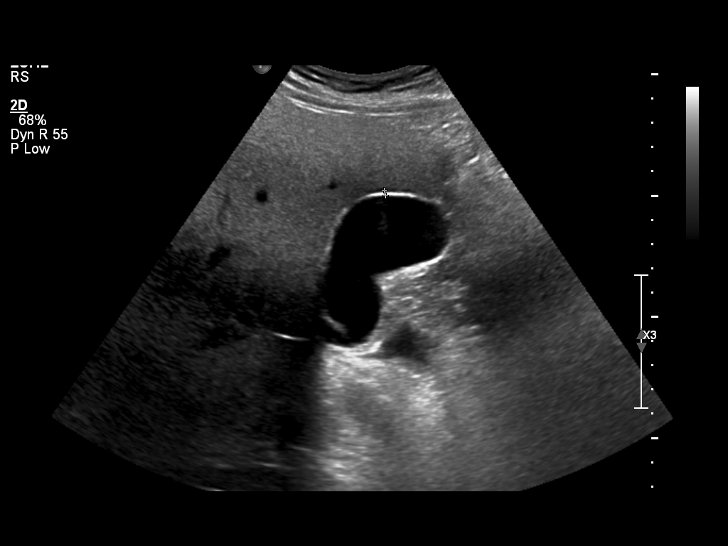
[im 54/87]
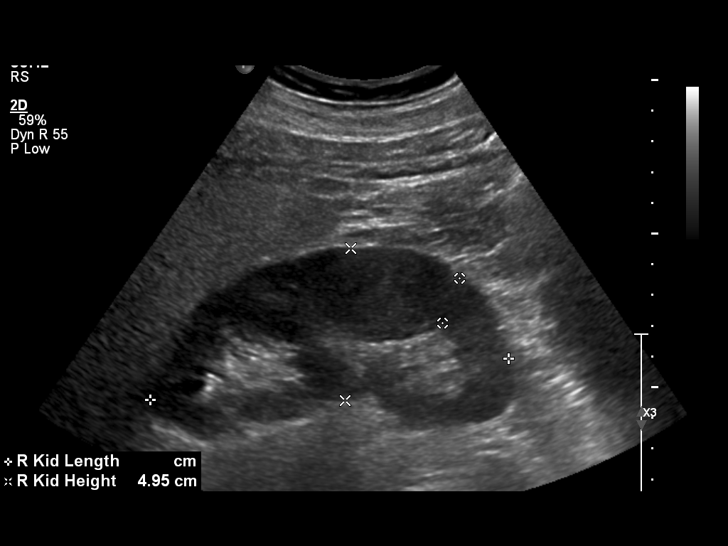
[im 58/87]
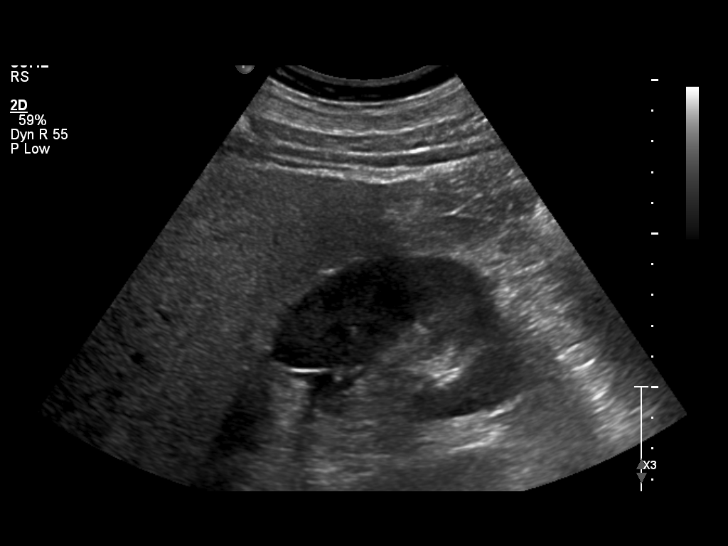
[im 65/87]
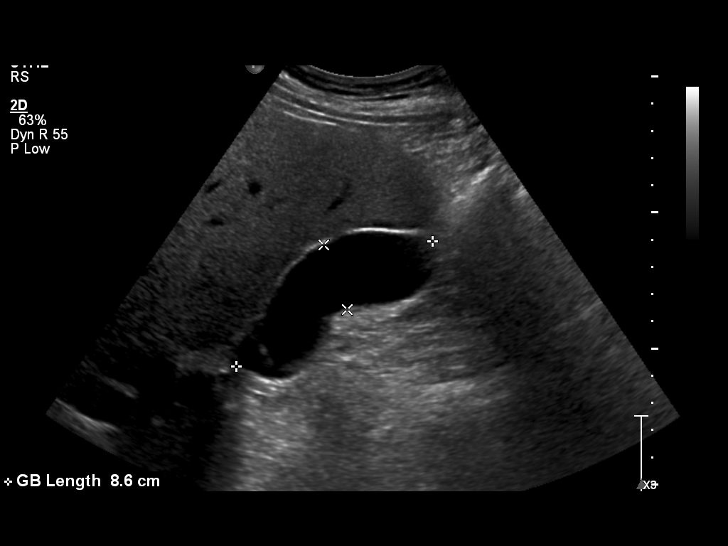
[im 72/87]
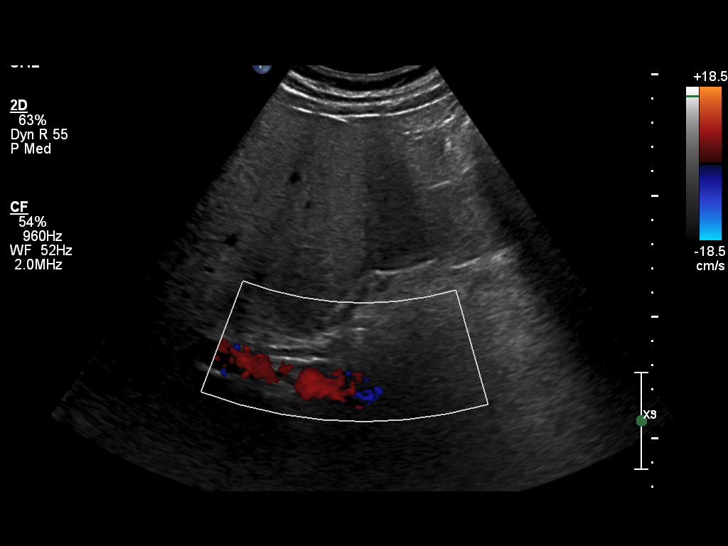
[im 79/87]
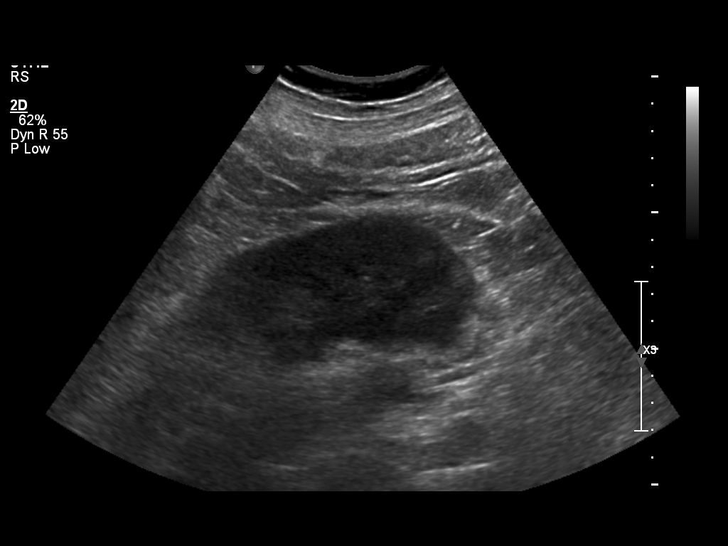
[im 87/87]
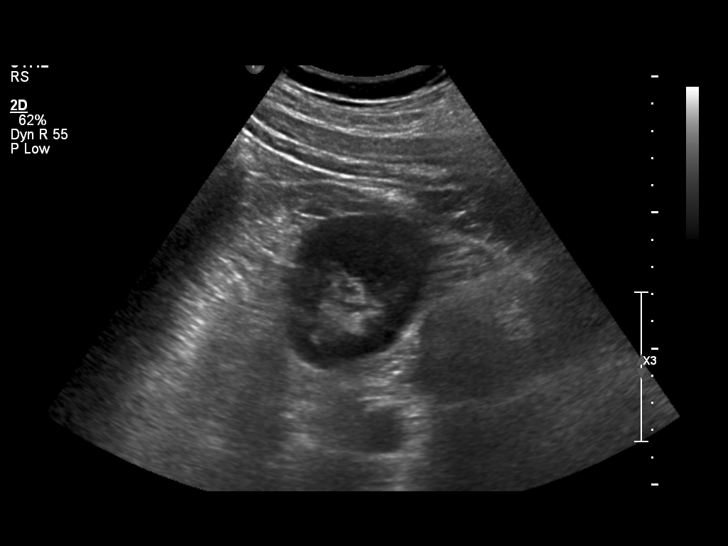

[14 of 25 positions shown; findings below may reference images not displayed]

EXAM

Ultrasound of the abdomen complete.

INDICATION

Left upper quadrant abdominal pain.

FINDINGS

The right kidney measures 11.7 centimeters length. The left kidney measures 12.1 cm in length. No
hydronephrosis is noted.

The spleen is not enlarged. The pancreas is unremarkable.

The IVC is patent in the liver. The aorta is not enlarged.

The gallbladder is thin walled. No stones are seen. The common bile duct measures 4.1 millimeters.
The liver is hyperechoic suggesting fatty infiltration.

IMPRESSION

The liver appears fatty infiltrated. There is no cholelithiasis or biliary duct dilatation. There
is no hydronephrosis.

Tech Notes:

ruq pain per pt x1 month; luq pain per dr order;

## 2019-06-13 ENCOUNTER — Ambulatory Visit: Admit: 2019-06-13 | Discharge: 2019-06-13 | Payer: MEDICAID

## 2019-06-13 ENCOUNTER — Encounter: Admit: 2019-06-13 | Discharge: 2019-06-13 | Payer: MEDICAID

## 2019-06-13 DIAGNOSIS — M5116 Intervertebral disc disorders with radiculopathy, lumbar region: Secondary | ICD-10-CM

## 2019-06-13 DIAGNOSIS — I10 Essential (primary) hypertension: Secondary | ICD-10-CM

## 2019-06-13 DIAGNOSIS — IMO0002 Alcohol use disorder: Secondary | ICD-10-CM

## 2019-06-13 DIAGNOSIS — E785 Hyperlipidemia, unspecified: Secondary | ICD-10-CM

## 2019-06-13 DIAGNOSIS — K7010 Alcoholic hepatitis without ascites: Secondary | ICD-10-CM

## 2019-06-13 DIAGNOSIS — K7581 Nonalcoholic steatohepatitis (NASH): Secondary | ICD-10-CM

## 2019-06-13 DIAGNOSIS — E119 Type 2 diabetes mellitus without complications: Secondary | ICD-10-CM

## 2019-06-13 DIAGNOSIS — M5416 Radiculopathy, lumbar region: Secondary | ICD-10-CM

## 2019-06-13 NOTE — Patient Instructions
Resources for medication management:    Dr. Corena Herter  [address]  [place], [place] [address]  [phone] to request appointment  [phone]) [phone] office    Dr. Sharyn Lull  [address], [address]  [place], [place] [address]  [phone]      Please let me know if there is anything else I can help you with,  Naomie Dean, RN BSN  Clinical Nurse Coordinator, Dr. Samara Deist  [facility]  Phone: [phone]  Fax: [phone]

## 2019-06-13 NOTE — Progress Notes
[facility] CLINIC NOTE       SUBJECTIVE: Roy Madden is in follow-up for ongoing care regarding back and lower extremity pain.  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 Jolayne Haines.  Walking standing and bending exacerbates his pain and pain is improved with rest.  Pain is traveling in the posterior and lateral aspects of the bilateral lower extremities to the dorsal aspect of the feet.  Epidural injections and medial branch blocks have not been of benefit in the past.  He has tried numerous medications including gabapentin and Lyrica which caused hallucinations as well as numerous muscle relaxants including Flexeril, Zanaflex and Robaxin none of which were helpful.  He is requesting a referral for medical management.         Review of Systems   Musculoskeletal: Positive for back pain.   All other systems reviewed and are negative.      Current Outpatient Medications:   ?  ASCENSIA CONTOUR test strip, Use one strip as directed before meals and at bedtime., Disp: 400 strip, Rfl: 3  ?  busPIRone (BUSPAR) 5 mg tablet, Take one-half tablet by mouth three times daily., Disp: 45 tablet, Rfl: 3  ?  empagliflozin (JARDIANCE) 25 mg tab, Take one tablet by mouth daily., Disp: 90 tablet, Rfl: 3  ?  fenofibrate nanocrystallized (TRICOR) 145 mg tablet, Take 145 mg by mouth daily., Disp: , Rfl:   ?  furosemide (LASIX) 20 mg tablet, Take 20 mg by mouth daily., Disp: , Rfl:   ?  hydroCHLOROthiazide (HYDRODIURIL) 25 mg tablet, Take 25 mg by mouth daily., Disp: , Rfl:   ?  insulin aspart U-100 (NOVOLOG FLEXPEN U-100 INSULIN) 100 unit/mL (3 mL) injection PEN, Inject  under the skin., Disp: , Rfl:   ?  losartan (COZAAR) 50 mg tablet, Take 50 mg by mouth daily., Disp: , Rfl:   ?  losartan-hydrochlorothiazide (HYZAAR) 100-25 mg tablet, Take 1 tablet by mouth daily., Disp: , Rfl: 3  ?  metaxalone(+) (METAXALL) 800 mg tablet, Take one tablet by mouth twice daily as needed for Pain., Disp: 60 tablet, Rfl: 3  ? methocarbamol (ROBAXIN) 750 mg tablet, Take one tablet by mouth twice daily., Disp: 60 tablet, Rfl: 0  ?  metoclopramide (REGLAN) 10 mg tablet, TAKE 1 TABLET BY MOUTH 3 TIMES A DAY, Disp: , Rfl: 3  ?  mirtazapine (REMERON) 30 mg tablet, Take 30 mg by mouth at bedtime daily., Disp: , Rfl:   ?  omeprazole DR(+) (PRILOSEC) 40 mg capsule, Take 40 mg by mouth daily., Disp: , Rfl:   ?  oxyCODONE (OXY-IR) 15 mg tablet, Take 15 mg by mouth every 4 hours as needed, Disp: , Rfl:   ?  potassium chloride (MICRO-K) 10 mEq capsule, Take 10 mEq by mouth daily., Disp: , Rfl:   ?  pravastatin (PRAVACHOL) 40 mg tablet, Take 40 mg by mouth at bedtime daily., Disp: , Rfl: 1  ?  pregabalin (LYRICA) 150 mg capsule, Take 150 mg by mouth twice daily., Disp: , Rfl:   ?  Sitagliptin-Metformin (JANUMET) 50-1,000 mg tab, Take 1 Tab by mouth twice daily with meals., Disp: , Rfl:   ?  tiZANidine (ZANAFLEX) 4 mg tablet, Take one tablet by mouth twice daily., Disp: 60 tablet, Rfl: 3  ?  TRESIBA FLEXTOUCH U-100 100 unit/mL (3 mL) inpn, , Disp: , Rfl:   No Known Allergies  Physical Exam  Vitals:    06/13/19 1504   BP: (!) (  P) 141/104   Pulse: (P) 102   Resp: 18   Temp: 36.1 ?C (96.9 ?F)   TempSrc: Oral   SpO2: 100%   Weight: 119.3 kg (263 lb)   Height: 182.9 cm (72)   PainSc: Nine        Pain Score: Nine  Body mass index is 35.67 kg/m?Marland Kitchen  General: Alert and oriented, very pleasant male.   HEENT showed extraocular muscles were intact and no other abnormalities.  Unlabored breathing.  Regular rate and rhythm on CV exam.   5/5 strength in bilateral upper and lower extremities.    Sensation is intact to light touch and equal in the upper and lower extremities.  There is bilateral lumbar tenderness to palpation         IMPRESSION:  1. Lumbar disc disease with radiculopathy    2. Lumbar radicular pain          PLAN: I have provided contact information for Dr. Corena Herter and Dr.Ng and will have Roy Madden low up on an as-needed basis in the future. I have communicated that I do not feel it is in his best interest to start on opioid analgesics on a daily basis for pain management of his chronic condition and he expresses understanding.  He will continue with home exercise plan as prescribed by physical therapy.

## 2021-02-14 ENCOUNTER — Encounter: Admit: 2021-02-14 | Discharge: 2021-02-14 | Payer: MEDICAID

## 2021-02-19 ENCOUNTER — Encounter: Admit: 2021-02-19 | Discharge: 2021-02-19 | Payer: MEDICAID

## 2021-02-25 ENCOUNTER — Encounter: Admit: 2021-02-25 | Discharge: 2021-02-25 | Payer: MEDICAID

## 2021-02-25 DIAGNOSIS — I739 Peripheral vascular disease, unspecified: Secondary | ICD-10-CM

## 2021-03-05 ENCOUNTER — Encounter: Admit: 2021-03-05 | Discharge: 2021-03-05 | Payer: MEDICAID

## 2021-03-05 ENCOUNTER — Ambulatory Visit: Admit: 2021-03-05 | Discharge: 2021-03-05 | Payer: MEDICAID

## 2021-03-05 DIAGNOSIS — K7010 Alcoholic hepatitis without ascites: Secondary | ICD-10-CM

## 2021-03-05 DIAGNOSIS — E785 Hyperlipidemia, unspecified: Secondary | ICD-10-CM

## 2021-03-05 DIAGNOSIS — I739 Peripheral vascular disease, unspecified: Secondary | ICD-10-CM

## 2021-03-05 DIAGNOSIS — E1142 Type 2 diabetes mellitus with diabetic polyneuropathy: Secondary | ICD-10-CM

## 2021-03-05 DIAGNOSIS — I10 Essential (primary) hypertension: Secondary | ICD-10-CM

## 2021-03-05 DIAGNOSIS — E119 Type 2 diabetes mellitus without complications: Secondary | ICD-10-CM

## 2021-03-05 DIAGNOSIS — K7581 Nonalcoholic steatohepatitis (NASH): Secondary | ICD-10-CM

## 2021-03-05 DIAGNOSIS — IMO0002 Alcohol use disorder: Secondary | ICD-10-CM

## 2021-03-05 NOTE — Progress Notes
Date of Service: 03/05/2021              Chief Complaint   Patient presents with   ? 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       History of Present Illness    The patient is a 45 year old male who presents today for evaluation of bilateral lower extremity intermittent discoloration and numbness. He has a history notable for diabetes. He states his hemoglobin A1C is around 9 however has been as high as 14 in the past. He states his feet discolor at dependent times. No history of wounds or toe amputations in the past. He states he has some new shoes but that they rub on his feet. He states is diligent about looking for wounds. He states is on disability for an injury to his back in the past. This problem with his feet and legs has been going on for a few months and has worsened hence the reason why he has sought attention. He has previously seen a diabetic consoler for helping with control of his diabetes however did not find this helpful.    Medical History:   Diagnosis Date   ? Alcohol use disorder    ? Hyperlipidemia    ? Hypertension    ? Obesity, Class III, BMI 40-49.9 (morbid obesity) (HCC)    ? Steatohepatitis, alcoholic    ? Steatohepatitis, non-alcoholic    ? Type 2 diabetes mellitus [facility])        Surgical History:   Procedure Laterality Date   ? ESOPHAGOGASTRODUODENOSCOPY WITH ENDOSCOPIC ULTRASOUND EXAMINATION - FLEXIBLE N/A 03/16/2018    Performed by Vertell Novak, MD at [facility] ENDO   ? ESOPHAGOGASTRODUODENOSCOPY WITH BIOPSY - FLEXIBLE N/A 03/16/2018    Performed by Vertell Novak, MD at [facility] ENDO   ? CHOLECYSTECTOMY         Allergies:  No Known Allergies    Medication List:  ? ASCENSIA CONTOUR test strip Use one strip as directed before meals and at bedtime.   ? busPIRone (BUSPAR) 5 mg tablet Take one-half tablet by mouth three times daily.   ? empagliflozin (JARDIANCE) 25 mg tab Take one tablet by mouth daily.   ? fenofibrate nanocrystallized (TRICOR) 145 mg tablet Take 145 mg by mouth daily.   ? furosemide (LASIX) 20 mg tablet Take 20 mg by mouth daily.   ? hydroCHLOROthiazide (HYDRODIURIL) 25 mg tablet Take 25 mg by mouth daily.   ? insulin aspart U-100 (NOVOLOG FLEXPEN U-100 INSULIN) 100 unit/mL (3 mL) injection PEN Inject  under the skin.   ? losartan (COZAAR) 50 mg tablet Take 50 mg by mouth daily.   ? losartan-hydrochlorothiazide (HYZAAR) 100-25 mg tablet Take 1 tablet by mouth daily.   ? metaxalone(+) (METAXALL) 800 mg tablet Take one tablet by mouth twice daily as needed for Pain.   ? methocarbamol (ROBAXIN) 750 mg tablet Take one tablet by mouth twice daily.   ? metoclopramide (REGLAN) 10 mg tablet TAKE 1 TABLET BY MOUTH 3 TIMES A DAY   ? mirtazapine (REMERON) 30 mg tablet Take 30 mg by mouth at bedtime daily.   ? omeprazole DR(+) (PRILOSEC) 40 mg capsule Take 40 mg by mouth daily.   ? oxyCODONE (OXY-IR) 15 mg tablet Take 15 mg by mouth every 4 hours as needed   ? potassium chloride (MICRO-K) 10 mEq capsule Take 10 mEq by mouth daily.   ? pravastatin (PRAVACHOL) 40 mg tablet Take 40 mg by mouth at  bedtime daily.   ? pregabalin (LYRICA) 150 mg capsule Take 150 mg by mouth twice daily.   ? Sitagliptin-Metformin (JANUMET) 50-1,000 mg tab Take 1 Tab by mouth twice daily with meals.   ? tiZANidine (ZANAFLEX) 4 mg tablet Take one tablet by mouth twice daily.   ? TRESIBA FLEXTOUCH U-100 100 unit/mL (3 mL) inpn        Social History:   reports that he has never smoked. He has never used smokeless tobacco. He reports current alcohol use of about 60.0 standard drinks of alcohol per week. He reports current drug use.    No family history on file.    Review of Systems   Constitutional: Positive for fatigue.   HENT: Negative.    Eyes: Negative.    Respiratory: Positive for apnea and shortness of breath.    Cardiovascular: Negative.    Gastrointestinal: Negative.    Endocrine: Negative.    Genitourinary: Negative.    Musculoskeletal: Negative.    Skin: Positive for rash.   Allergic/Immunologic: Negative.    Neurological: Positive for weakness.   Hematological: Negative.    Psychiatric/Behavioral: Negative.                There were no vitals filed for this visit.  There is no height or weight on file to calculate BMI.     Physical Exam  Constitutional:       Appearance: Normal appearance.   HENT:      Head: Normocephalic.      Mouth/Throat:      Mouth: Mucous membranes are dry.   Cardiovascular:      Rate and Rhythm: Normal rate and regular rhythm.      Pulses: Normal pulses.      Comments: Palpable bilateral DPs  Pulmonary:      Effort: Pulmonary effort is normal.   Abdominal:      General: Abdomen is flat.      Palpations: Abdomen is soft.   Musculoskeletal:         General: No swelling, tenderness, deformity or signs of injury. Normal range of motion.      Cervical back: Normal range of motion and neck supple.      Right lower leg: No edema.      Left lower leg: No edema.      Comments: Numbness of bilateral toes and plantar aspect of feet. No wounds or skin breakdown.     Skin:     General: Skin is warm and dry.      Capillary Refill: Capillary refill takes less than 2 seconds.   Neurological:      Mental Status: He is alert.         Right Brachial Systolic: 133   Left Brachial Systolic: 129   Right ABI: 1.2  Left ABI: 1.19      Assessment and Plan:    1. Diabetic polyneuropathy associated with type 2 diabetes mellitus [facility])       The patient is a 45 year old male with complications of diabetes with neuropathy. His ABIs and PPG demonstrate some evidence of non-compressible vessels and likely microvascular disease. I see no significant arterial occlusive disease. I discussed that he needs better glycemic control. I defer this care to this primary care doctor. He states that he has seen a diabetic nutritionist in the past however this has not helped. We will still make a referral for him. Regarding his shoes he would benefit from some orthotic shoes and  we will given him a prescription to [facility] today. I see no wounds however he will keep an eye on this. He has tried gabapentin and lyrica in the past for neuropathy however states that he had problems from this. I will defer this to this primary care physician. We will follow-up with as needed.

## 2021-06-06 ENCOUNTER — Encounter: Admit: 2021-06-06 | Discharge: 2021-06-06 | Payer: MEDICAID
